# Patient Record
Sex: Male | Born: 1955 | Race: White | Hispanic: No | Marital: Married | Smoking: Never smoker
Health system: Southern US, Community
[De-identification: ages and names within clinical notes are randomized; demographics above are authoritative.]

## PROBLEM LIST (undated history)

## (undated) DIAGNOSIS — I10 Essential (primary) hypertension: Secondary | ICD-10-CM

## (undated) DIAGNOSIS — I4891 Unspecified atrial fibrillation: Secondary | ICD-10-CM

## (undated) DIAGNOSIS — B1920 Unspecified viral hepatitis C without hepatic coma: Secondary | ICD-10-CM

## (undated) DIAGNOSIS — D490 Neoplasm of unspecified behavior of digestive system: Secondary | ICD-10-CM

## (undated) DIAGNOSIS — E119 Type 2 diabetes mellitus without complications: Secondary | ICD-10-CM

## (undated) HISTORY — PX: CARDIOVERSION: SHX1299

## (undated) HISTORY — PX: KIDNEY STONE SURGERY: SHX686

---

## 1980-11-12 HISTORY — PX: SPLENECTOMY: SUR1306

## 2013-07-26 ENCOUNTER — Ambulatory Visit: Payer: Self-pay | Admitting: Emergency Medicine

## 2013-11-23 ENCOUNTER — Ambulatory Visit: Payer: Self-pay | Admitting: Physician Assistant

## 2020-10-10 ENCOUNTER — Emergency Department: Payer: Medicare Other

## 2020-10-10 ENCOUNTER — Other Ambulatory Visit: Payer: Self-pay

## 2020-10-10 ENCOUNTER — Emergency Department
Admission: EM | Admit: 2020-10-10 | Discharge: 2020-10-11 | Disposition: A | Discharge status: Home / Self Care | Payer: Medicare Other | Attending: Emergency Medicine | Admitting: Emergency Medicine

## 2020-10-10 DIAGNOSIS — R2990 Unspecified symptoms and signs involving the nervous system: Secondary | ICD-10-CM | POA: Diagnosis not present

## 2020-10-10 DIAGNOSIS — Z7901 Long term (current) use of anticoagulants: Secondary | ICD-10-CM | POA: Diagnosis not present

## 2020-10-10 DIAGNOSIS — M5416 Radiculopathy, lumbar region: Secondary | ICD-10-CM | POA: Diagnosis not present

## 2020-10-10 DIAGNOSIS — I4891 Unspecified atrial fibrillation: Secondary | ICD-10-CM | POA: Diagnosis not present

## 2020-10-10 DIAGNOSIS — M545 Low back pain, unspecified: Secondary | ICD-10-CM | POA: Diagnosis not present

## 2020-10-10 DIAGNOSIS — R531 Weakness: Secondary | ICD-10-CM | POA: Insufficient documentation

## 2020-10-10 DIAGNOSIS — R200 Anesthesia of skin: Secondary | ICD-10-CM | POA: Diagnosis present

## 2020-10-10 DIAGNOSIS — E119 Type 2 diabetes mellitus without complications: Secondary | ICD-10-CM | POA: Diagnosis not present

## 2020-10-10 DIAGNOSIS — I10 Essential (primary) hypertension: Secondary | ICD-10-CM | POA: Insufficient documentation

## 2020-10-10 DIAGNOSIS — R29898 Other symptoms and signs involving the musculoskeletal system: Secondary | ICD-10-CM | POA: Diagnosis not present

## 2020-10-10 HISTORY — DX: Type 2 diabetes mellitus without complications: E11.9

## 2020-10-10 HISTORY — DX: Neoplasm of unspecified behavior of digestive system: D49.0

## 2020-10-10 HISTORY — DX: Essential (primary) hypertension: I10

## 2020-10-10 HISTORY — DX: Unspecified atrial fibrillation: I48.91

## 2020-10-10 HISTORY — DX: Unspecified viral hepatitis C without hepatic coma: B19.20

## 2020-10-10 LAB — DIFFERENTIAL
Abs Immature Granulocytes: 0.05 10*3/uL (ref 0.00–0.07)
Basophils Absolute: 0.1 10*3/uL (ref 0.0–0.1)
Basophils Relative: 1 %
Eosinophils Absolute: 0.2 10*3/uL (ref 0.0–0.5)
Eosinophils Relative: 1 %
Immature Granulocytes: 0 %
Lymphocytes Relative: 20 %
Lymphs Abs: 2.9 10*3/uL (ref 0.7–4.0)
Monocytes Absolute: 0.9 10*3/uL (ref 0.1–1.0)
Monocytes Relative: 6 %
Neutro Abs: 10.3 10*3/uL — ABNORMAL HIGH (ref 1.7–7.7)
Neutrophils Relative %: 72 %

## 2020-10-10 LAB — COMPREHENSIVE METABOLIC PANEL
ALT: 33 U/L (ref 0–44)
AST: 30 U/L (ref 15–41)
Albumin: 4.5 g/dL (ref 3.5–5.0)
Alkaline Phosphatase: 94 U/L (ref 38–126)
Anion gap: 9 (ref 5–15)
BUN: 14 mg/dL (ref 8–23)
CO2: 26 mmol/L (ref 22–32)
Calcium: 10.2 mg/dL (ref 8.9–10.3)
Chloride: 102 mmol/L (ref 98–111)
Creatinine, Ser: 1.12 mg/dL (ref 0.61–1.24)
GFR, Estimated: >60 mL/min (ref >60)
Glucose, Bld: 207 mg/dL — ABNORMAL HIGH (ref 70–99)
Potassium: 4.8 mmol/L (ref 3.5–5.1)
Sodium: 137 mmol/L (ref 135–145)
Total Bilirubin: 0.6 mg/dL (ref 0.3–1.2)
Total Protein: 8.1 g/dL (ref 6.5–8.1)

## 2020-10-10 LAB — CBC
HCT: 45.6 % (ref 39.0–52.0)
Hemoglobin: 15.4 g/dL (ref 13.0–17.0)
MCH: 29.8 pg (ref 26.0–34.0)
MCHC: 33.8 g/dL (ref 30.0–36.0)
MCV: 88.2 fL (ref 80.0–100.0)
Platelets: 228 10*3/uL (ref 150–400)
RBC: 5.17 MIL/uL (ref 4.22–5.81)
RDW: 13.7 % (ref 11.5–15.5)
WBC: 14.4 10*3/uL — ABNORMAL HIGH (ref 4.0–10.5)
nRBC: 0 % (ref 0.0–0.2)

## 2020-10-10 LAB — APTT: aPTT: 25 seconds (ref 24–36)

## 2020-10-10 LAB — PROTIME-INR
INR: 1.1 (ref 0.8–1.2)
Prothrombin Time: 14.1 seconds (ref 11.4–15.2)

## 2020-10-10 MED ORDER — SODIUM CHLORIDE 0.9% FLUSH: 3.0000 mL | Freq: Once | INTRAVENOUS | Status: DC

## 2020-10-10 MED ORDER — GABAPENTIN 300 MG PO CAPS
300.0000 mg | ORAL_CAPSULE | Freq: Once | ORAL | Status: AC
  Administered 2020-10-10: 300 mg via ORAL
  Filled 2020-10-10: qty 1

## 2020-10-10 MED ORDER — GADOBUTROL 1 MMOL/ML IV SOLN
9.0000 mL | Freq: Once | INTRAVENOUS | Status: AC | PRN
  Administered 2020-10-10: 9 mL via INTRAVENOUS

## 2020-10-10 MED ORDER — METHYLPREDNISOLONE SODIUM SUCC 125 MG IJ SOLR
125.0000 mg | Freq: Once | INTRAMUSCULAR | Status: AC
  Administered 2020-10-10: 125 mg via INTRAVENOUS
  Filled 2020-10-10: qty 2

## 2020-10-10 MED ORDER — GABAPENTIN 300 MG PO CAPS: 300.0000 mg | ORAL_CAPSULE | Freq: Three times a day (TID) | ORAL | 0 refills | Status: AC

## 2020-10-10 MED ORDER — LACTATED RINGERS IV BOLUS
1000.0000 mL | Freq: Once | INTRAVENOUS | Status: AC
  Administered 2020-10-10: 1000 mL via INTRAVENOUS

## 2020-10-10 MED ORDER — PREDNISONE 10 MG (21) PO TBPK: ORAL_TABLET | ORAL | 0 refills | Status: DC

## 2020-10-10 NOTE — ED Notes (Signed)
Pt back from MRI 

## 2020-10-10 NOTE — ED Notes (Signed)
MD Neurologist not calling stoke

## 2020-10-10 NOTE — Discharge Instructions (Signed)
Please seek medical attention for any high fevers, chest pain, shortness of breath, change in behavior, persistent vomiting, bloody stool or any other new or concerning symptoms.  

## 2020-10-10 NOTE — ED Notes (Signed)
Pt brought in room.   Neurology at bedside

## 2020-10-10 NOTE — ED Notes (Signed)
Patient transported to MRI 

## 2020-10-10 NOTE — Consult Note (Signed)
NEURO HOSPITALIST CONSULT NOTE   Requestig physician: Dr. Cheri Fowler  Reason for Consult: Acute onset of LLE weakness  History obtained from:  Patient and Chart    HPI:                                                                                                                                          Jared Dominguez is an 64 y.o. 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 He states that yesterday, he was out all day on his riding lawnmower. While mowing in the seated position on the mower, he started to have left lower back pain which gradually became excruciating, rated as 8/10 by the patient. He also noticed new onset of left anterior thigh pain. He did not have paresthesias and did not notice on his own any sensory loss. He at that time also did not have any weakness. He tried to medicate away the pain, which worked somewhat, but not to the extent that he could sleep through it. He states "I was up all night from the pain". He thought that the pain was from a kidney stone. He got up this AM and was able to ambulate despite the pain. At 3 PM today, while walking down a set of stairs, he felt acute onset of LLE weakness and his leg started to buckle under him. He called EMS who brought him to the ED. On arrival, he stated that he could not lift his LLE at the hip with knee extended, but could lift up vertically with knee bent. A code stroke was called.    Past Medical History:  Diagnosis Date  . Atrial fibrillation ([place])   . Diabetes mellitus without complication ([place])   . Hepatitis C   . Hypertension   . Tumor of liver     Past Surgical History:  Procedure Laterality Date  . CARDIOVERSION    . KIDNEY STONE SURGERY    . SPLENECTOMY  1982     No family history on file.           Social History:  reports that he has never smoked. He has never used smokeless tobacco. He reports current alcohol use. He reports that he does not use drugs.  Allergies  Allergen Reactions  . Codeine   . Sulfa Antibiotics     MEDICATIONS:  Meloxicam PRN knee pain Atorvastatin 40mg   Lisinopril 5mg  daily Amlodipine 10mg  daily  Carvedilol 3.125mg  BID Tikosyn 500 mg bid  ROS:                                                                                                                                       As per HPI. Does not endorse additional complaints. Detailed ROS deferred in the context of acuity of presentation.    Blood pressure (!) 87/52, pulse 82, temperature 98.4 F (36.9 C), temperature source Oral, resp. rate 16, height 6' (1.829 m), weight 99.3 kg, SpO2 95 %.   General Examination:                                                                                                       Physical Exam  HEENT- [place]/AT   Lungs- Respirations unlabored Extremities- No edema  Neurological Examination Mental Status: Alert, oriented, thought content appropriate.  Speech fluent without evidence of aphasia.  Able to follow all commands without difficulty. Cranial Nerves: II: PERRL. Visual fields intact with no extinction to DSS.  III,IV, VI: No ptosis. EOMI.  V,VII: Smile symmetric, facial temp sensation equal bilaterally VIII: hearing intact to voice IX,X: No hypophonia XI: Symmetric XII: Midline tongue extension Motor: RUE and LUE: 5/5 RLE: 5/5 proximally and distally in all muscle groups LLE: Hip flexion 4-/5, hip adduction and abduction 5/5, knee flexion 5/5, knee extension 2/5, ADF and APF 5/5 Sensory: Decreased temp and FT sensation to anterior lower leg along the tibia, sparing the knee and foot. Sensation to  RLE and BUE is normal.  Deep Tendon Reflexes:  2+ bilateral brachioradialis and biceps Low amplitude left patellar, but there is crossed adductor response on the right.  Right patellar 3+ Achilles Trace bilaterally Plantars: Downgoing bilaterally Cerebellar: No ataxia with FNF bilaterally.  No ataxia with right H-S, limited assessment of left H-S shows no ataxia disproportionate to weakness Gait: Unable to bear weight with LLE with pivoting from CT table to wheelchair   Lab Results: Basic Metabolic Panel: Recent Labs  Lab 10/10/20 1845  NA 137  K 4.8  CL 102  CO2 26  GLUCOSE 207*  BUN 14  CREATININE 1.12  CALCIUM 10.2    CBC: Recent Labs  Lab 10/10/20 1845  WBC 14.4*  NEUTROABS 10.3*  HGB 15.4  HCT 45.6  MCV 88.2  PLT 228    Cardiac Enzymes: No results for input(s): CKTOTAL, CKMB, CKMBINDEX, TROPONINI in the last  168 hours.  Lipid Panel: No results for input(s): CHOL, TRIG, HDL, CHOLHDL, VLDL, LDLCALC in the last 168 hours.  Imaging: CT HEAD CODE STROKE WO CONTRAST  Result Date: 10/10/2020 CLINICAL DATA:  Code stroke. Acute neuro deficit. Left lower extremity numbness. EXAM: CT HEAD WITHOUT CONTRAST TECHNIQUE: Contiguous axial images were obtained from the base of the skull through the vertex without intravenous contrast. COMPARISON:  None. FINDINGS: Brain: No evidence of acute infarction, hemorrhage, hydrocephalus, extra-axial collection or mass lesion/mass effect. Vascular: Negative for hyperdense vessel Skull: Negative Sinuses/Orbits: Paranasal sinuses clear.  Negative orbit Other: None ASPECTS ([place] Stroke Program Early CT Score) - Ganglionic level infarction (caudate, lentiform nuclei, internal capsule, insula, M1-M3 cortex): 7 - Supraganglionic infarction (M4-M6 cortex): 3 Total score (0-10 with 10 being normal): 10 IMPRESSION: 1. Negative CT head 2. ASPECTS is 10 Electronically Signed   By: Franchot Gallo M.D.   On: 10/10/2020 19:05    Assessment: 64 year  old male presenting with acute onset of LLE weakness 1. Exam findings and symptoms are most consistent with an acute left L4 radiculopathy. Sensory loss along anterior leg below the knee is consistent with an L4 distribution. Anterior thigh pain is also consistent with L4 impingement. The pattern of knee extensor severe weakness and moderate hip flexor weakness is in the L4 distribution. There is sparing of ADF, APF, knee flexion and hip adduction and abduction strength.  2. Of note, there is also most likely chronic finding of crossed adductor response on the right when eliciting left patellar reflex, as well as 2 beats of clonus of right leg after eliciting right patellar. These findings suggest additional pathology, possibly affecting the thoracic cord, which may be chronic. DDx includes thoracic disc protrusion.  3. CT head negative for acute abnormality.  4. Overall localizing features of exam are not consistent with acute stroke.   Recommendations: 1. MRI of lumbar spine with and without contrast 2. MRI of thoracic spine with and without contrast   Electronically signed: Dr. Kerney Elbe 10/10/2020, 7:22 PM

## 2020-10-10 NOTE — ED Provider Notes (Signed)
[facility] Emergency Department Provider Note   ____________________________________________   First MD Initiated Contact with Patient 10/10/20 1901     (approximate)  I have reviewed the triage vital signs and the nursing notes.   HISTORY  Chief Complaint Numbness    HPI Jared Dominguez is a 64 y.o. male with a stated past medical history of type 2 diabetes and atrial fibrillation and hypertension who presents for left lower extremity and back pain.  Patient states that he has had left lumbar back pain that is been worsening over the last 6 months however patient is having worsening pain over the last 24 hours.  This sharp, 9/10, nonradiating left lower lumbar paraspinal back pain is associated with left lower extremity numbness as well as muscular weakness over the anterior portion of his left thigh with the inability to extend the leg at the knee joint.  Patient states that he attempted to bear weight on this left lower extremity prior to arrival and fell over to the side as he could not bear weight on this leg.  Patient states the symptoms have much improved at this time and denies any pain however does endorse continuing weakness in the left lower extremity.  Patient denies any recent trauma or symptoms similar to this in the past.  Patient denies any history of lumbar spinal surgery.  Given symptoms, code stroke called from triage         Past Medical History:  Diagnosis Date  . Atrial fibrillation ([place])   . Diabetes mellitus without complication ([place])   . Hepatitis C   . Hypertension   . Tumor of liver     There are no problems to display for this patient.   Past Surgical History:  Procedure Laterality Date  . CARDIOVERSION    . KIDNEY STONE SURGERY    . SPLENECTOMY  1982    Prior to Admission medications   Not on File    Allergies Codeine and Sulfa antibiotics  No family history on file.  Social History Social History    Tobacco Use  . Smoking status: Never Smoker  . Smokeless tobacco: Never Used  Substance Use Topics  . Alcohol use: Yes    Comment: on holidays   . Drug use: Never    Review of Systems Constitutional: No fever/chills Eyes: No visual changes. ENT: No sore throat. Cardiovascular: Denies chest pain. Respiratory: Denies shortness of breath. Gastrointestinal: No abdominal pain.  No nausea, no vomiting.  No diarrhea. Genitourinary: Negative for dysuria. Musculoskeletal: Negative for acute arthralgias Skin: Negative for rash. Neurological: Negative for headaches, endorses weakness/numbness/paresthesias in left lower extremity Psychiatric: Negative for suicidal ideation/homicidal ideation   ____________________________________________   PHYSICAL EXAM:  VITAL SIGNS: ED Triage Vitals  Enc Vitals Group     BP 10/10/20 1838 (!) 87/52     Pulse Rate 10/10/20 1829 82     Resp 10/10/20 1829 16     Temp 10/10/20 1829 98.4 F (36.9 C)     Temp Source 10/10/20 1829 Oral     SpO2 10/10/20 1829 95 %     Weight 10/10/20 1830 219 lb (99.3 kg)     Height 10/10/20 1830 6' (1.829 m)     Head Circumference --      Peak Flow --      Pain Score 10/10/20 1830 3     Pain Loc --      Pain Edu? --      Excl. in  GC? --    Constitutional: Alert and oriented. Well appearing and in no acute distress. Eyes: Conjunctivae are normal. PERRL. Head: Atraumatic. Nose: No congestion/rhinnorhea. Mouth/Throat: Mucous membranes are moist. Neck: No stridor Cardiovascular: Grossly normal heart sounds.  Good peripheral circulation. Respiratory: Normal respiratory effort.  No retractions. Gastrointestinal: Soft and nontender. No distention. Musculoskeletal: No obvious deformities Neurologic:  Normal speech and language.  Sensation decreased over the left shin, decreased range of motion at the left knee due to muscular weakness.  3/5 strength in the left quad Skin:  Skin is warm and dry. No rash  noted. Psychiatric: Mood and affect are normal. Speech and behavior are normal.  ____________________________________________   LABS (all labs ordered are listed, but only abnormal results are displayed)  Labs Reviewed  CBC - Abnormal; Notable for the following components:      Result Value   WBC 14.4 (*)    All other components within normal limits  DIFFERENTIAL - Abnormal; Notable for the following components:   Neutro Abs 10.3 (*)    All other components within normal limits  COMPREHENSIVE METABOLIC PANEL - Abnormal; Notable for the following components:   Glucose, Bld 207 (*)    All other components within normal limits  PROTIME-INR  APTT  CBG MONITORING, ED  I-STAT CREATININE, ED   ____________________________________________  EKG  ED ECG REPORT I, Naaman Plummer, the attending physician, personally viewed and interpreted this ECG.  Date: 10/10/2020 EKG Time: 1833 Rate: 113 Rhythm: Atrial fibrillation with rapid ventricular response QRS Axis: normal Intervals: normal ST/T Wave abnormalities: normal Narrative Interpretation: no evidence of acute ischemia  ____________________________________________  RADIOLOGY  ED MD interpretation: CT noncontrast of the head shows no evidence of acute abnormalities including no intracranial hemorrhage, edema, or mass-effect  Official radiology report(s): CT HEAD CODE STROKE WO CONTRAST  Result Date: 10/10/2020 CLINICAL DATA:  Code stroke. Acute neuro deficit. Left lower extremity numbness. EXAM: CT HEAD WITHOUT CONTRAST TECHNIQUE: Contiguous axial images were obtained from the base of the skull through the vertex without intravenous contrast. COMPARISON:  None. FINDINGS: Brain: No evidence of acute infarction, hemorrhage, hydrocephalus, extra-axial collection or mass lesion/mass effect. Vascular: Negative for hyperdense vessel Skull: Negative Sinuses/Orbits: Paranasal sinuses clear.  Negative orbit Other: None ASPECTS ([place]  Stroke Program Early CT Score) - Ganglionic level infarction (caudate, lentiform nuclei, internal capsule, insula, M1-M3 cortex): 7 - Supraganglionic infarction (M4-M6 cortex): 3 Total score (0-10 with 10 being normal): 10 IMPRESSION: 1. Negative CT head 2. ASPECTS is 10 Electronically Signed   By: Franchot Gallo M.D.   On: 10/10/2020 19:05    ____________________________________________   PROCEDURES  Procedure(s) performed (including Critical Care):  .1-3 Lead EKG Interpretation Performed by: Naaman Plummer, MD Authorized by: Naaman Plummer, MD     Interpretation: abnormal     ECG rate:  97   ECG rate assessment: normal     Rhythm: atrial fibrillation     Ectopy: none     Conduction: normal       ____________________________________________   INITIAL IMPRESSION / ASSESSMENT AND PLAN / ED COURSE  As part of my medical decision making, I reviewed the following data within the [place] notes reviewed and incorporated, Labs reviewed, EKG interpreted, Old chart reviewed, Radiograph reviewed and Notes from prior ED visits reviewed and incorporated        Patient is a 64 year old male with a stated past medical history as described above who presents for left lower  extremity neurologic symptoms.  Differential diagnosis includes CVA, TIA, cauda equina syndrome, spinal epidural abscess, spinal nerve compression.  A code stroke was called from triage and patient was evaluated by the neurologist who recommended an MRI of the thoracic and lumbar spine with concerns that the symptoms are likely coming from left L4 radiculopathy.  Patient agrees with plan and these MRIs are pending at the end of my shift.  Care of this patient will be signed out to the oncoming physician at the end of my shift.  All pertinent patient information conveyed and all questions answered.  All further care and disposition decisions will be made by the oncoming physician.       ____________________________________________   FINAL CLINICAL IMPRESSION(S) / ED DIAGNOSES  Final diagnoses:  Numbness and tingling of left lower extremity  Weakness of left lower extremity  Pain in left lumbar region of back     ED Discharge Orders    None       Note:  This document was prepared using Dragon voice recognition software and may include unintentional dictation errors.   Naaman Plummer, MD 10/10/20 2000

## 2020-10-10 NOTE — ED Triage Notes (Signed)
PT to ED via EMS from home. PT started having low back pain last night that got progressively worse, pt unable to sleep, thought maybe it was a kidney stone. Took a percocet this morning and Toradol and helped a little but pain came back. However, pt went to stand up earlier and fell down d/t his L leg being numb. PT states prior today he noticed pain in his leg. PT cannot move leg out and up but can lift up vertically with knee bent.

## 2020-10-10 NOTE — Progress Notes (Signed)
CODE STROKE- PHARMACY COMMUNICATION   Time CODE STROKE called/page received: 1856  Time response to CODE STROKE was made (in person or via phone): review of chart  Time Stroke Kit retrieved from [place] (only if needed): NA  Name of Provider/Nurse contacted: no code stroke per Neurology    Tawnya Crook ,PharmD Clinical Pharmacist  10/10/2020  7:46 PM

## 2020-10-10 NOTE — ED Notes (Signed)
Neurology MD read CT

## 2020-10-10 NOTE — ED Notes (Signed)
ED Provider at bedside. 

## 2020-10-11 NOTE — ED Notes (Signed)
Pt states that he will come back if he is unable to get around home.

## 2020-10-11 NOTE — ED Notes (Addendum)
Pt is refusing to get up and walk and says he is not able to bear weight on the left leg. Pt states he wants to go home and  That at home he has crutches and a wheel chair and is able to get around at home with those assistive devices. Pt states he also is going to have physical therapy for a knee injury. Pt states that he wants to go home and does not want to stay in the hospital. Pt stated "If I dont think I could get around at home, I would stay in the hospital."  ER Provider notified and stated he is okay with pt being discharged, if pt feels safe and wants to be discharged. Pt to be taken to car in wheelchair with RN and helped into car.

## 2020-10-11 NOTE — ED Notes (Signed)
Pt is able to get into wheelchair without assistance. Pt is also able to stand up with no assistance.

## 2021-11-03 IMAGING — MR MR LUMBAR SPINE WO/W CM
6 of 8 series · 36 of 48 positions shown · IV contrast (agent unspecified)
Comparison: None.

CLINICAL DATA: Left lower extremity pain and back pain.

EXAM:
MRI THORACIC SPINE WITHOUT CONTRAST
MRI LUMBAR SPINE WITHOUT AND WITH CONTRAST
TECHNIQUE: Multiplanar and multiecho pulse sequences of the thoracic and lumbar
spine were obtained without intravenous contrast. Post-contrast
images of the lumbar spine were also obtained.

[Series 26: T2 · sagittal · 4.0mm · 0.88mm/px · 4 of 17 slices shown (1 of 3)]
[im 1/17]
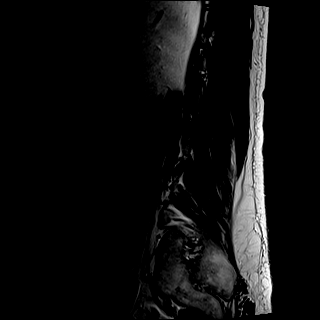
[im 6/17]
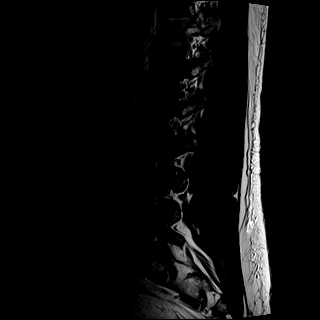
[im 11/17]
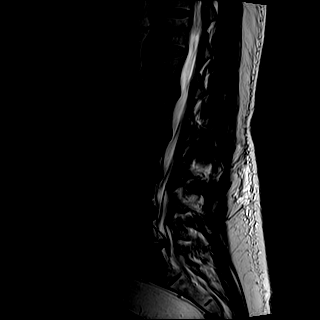
[im 17/17]
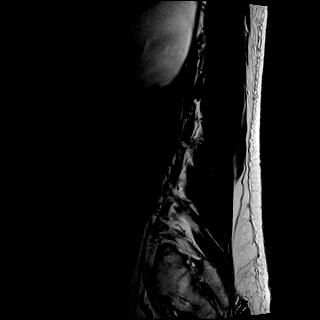

[Series 27: T1 · sagittal · 4.0mm · 0.88mm/px · 4 of 17 slices shown (1 of 2)]
[im 1/17]
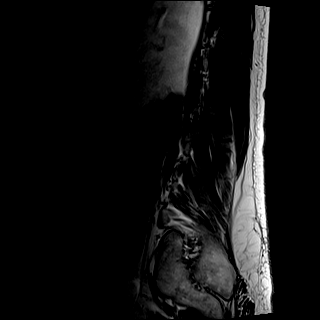
[im 6/17]
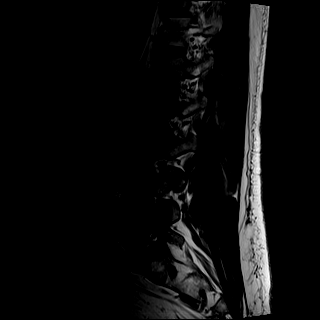
[im 11/17]
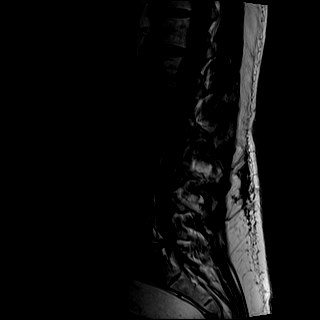
[im 17/17]
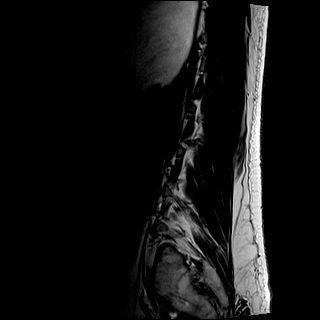

[Series 29: T2 · axial · 4.0mm · 0.78mm/px · z∈[-574,-356]mm · 8 of 39 slices shown (2 of 3)]
[im 1/39]
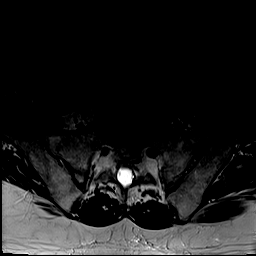
[im 6/39]
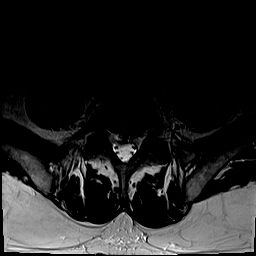
[im 11/39]
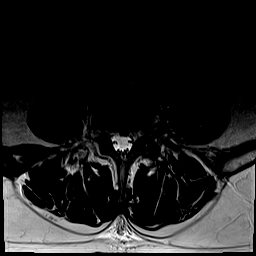
[im 17/39]
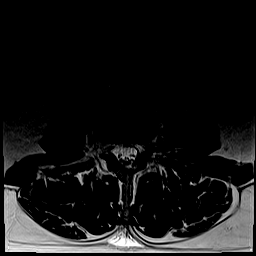
[im 22/39]
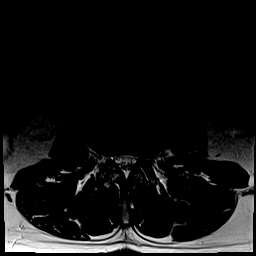
[im 28/39]
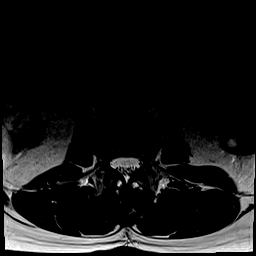
[im 33/39]
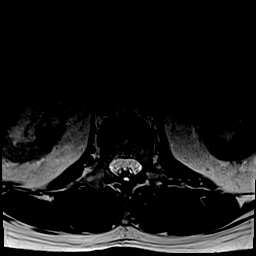
[im 39/39]
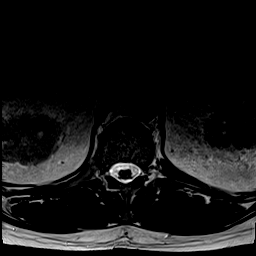

[Series 30: T1 · axial · 4.0mm · 0.39mm/px · z∈[-574,-356]mm · 8 of 39 slices shown (2 of 2)]
[im 1/39]
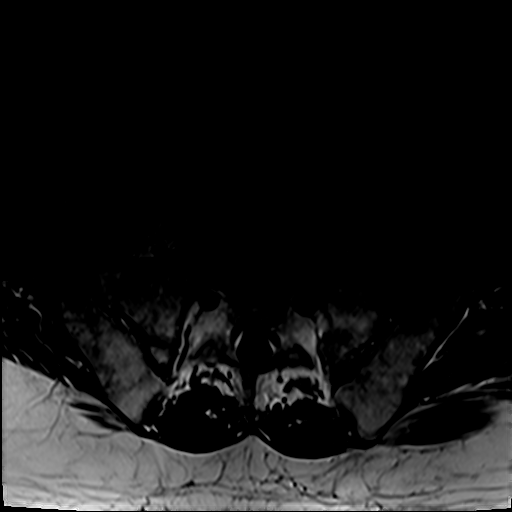
[im 6/39]
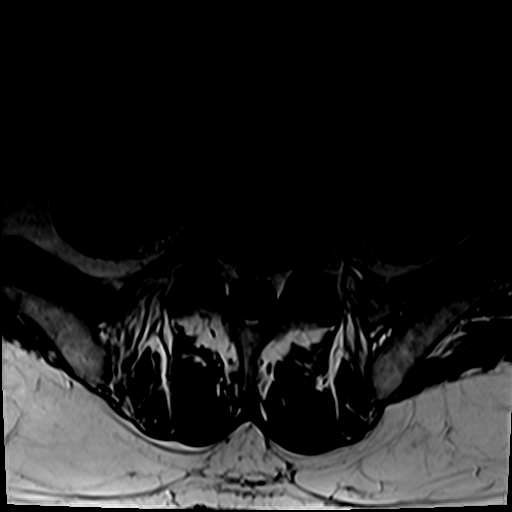
[im 11/39]
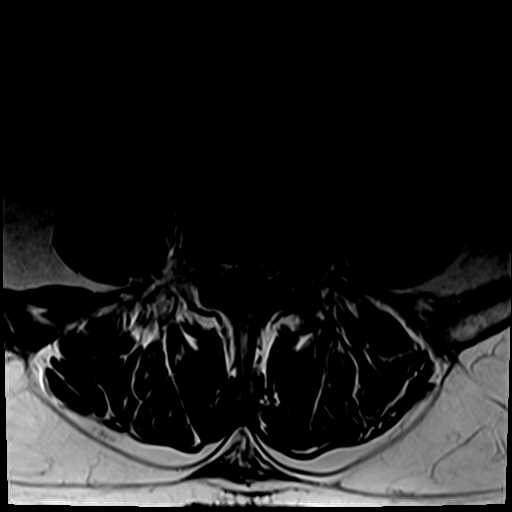
[im 17/39]
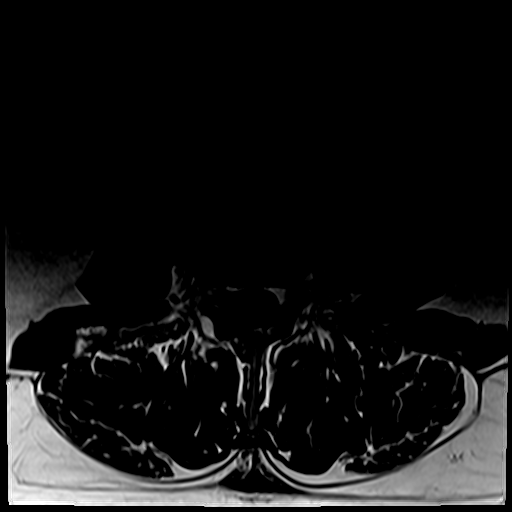
[im 22/39]
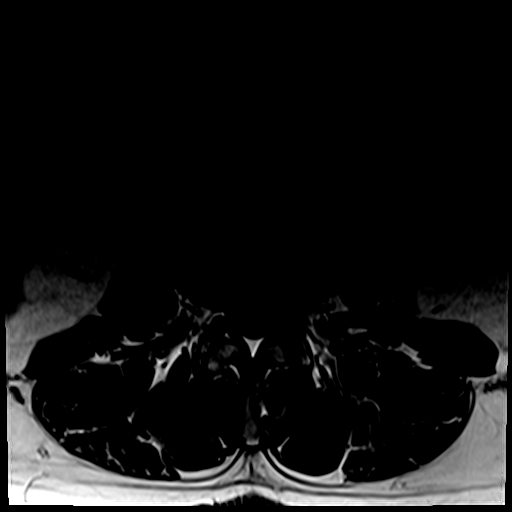
[im 28/39]
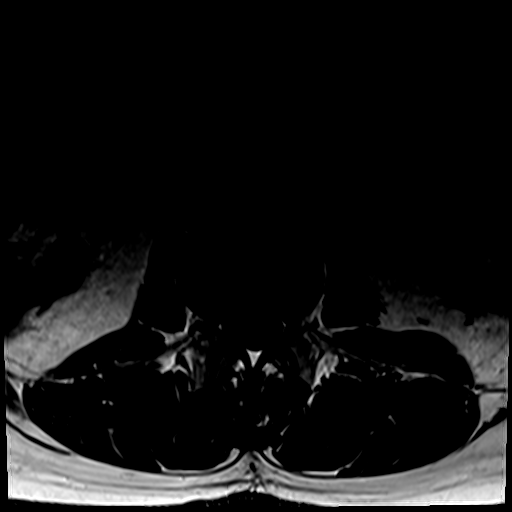
[im 33/39]
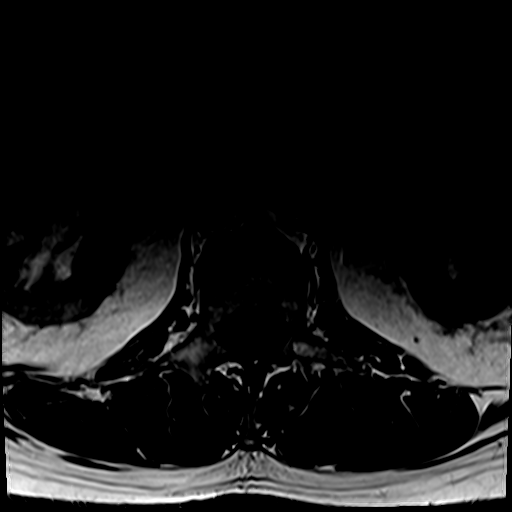
[im 39/39]
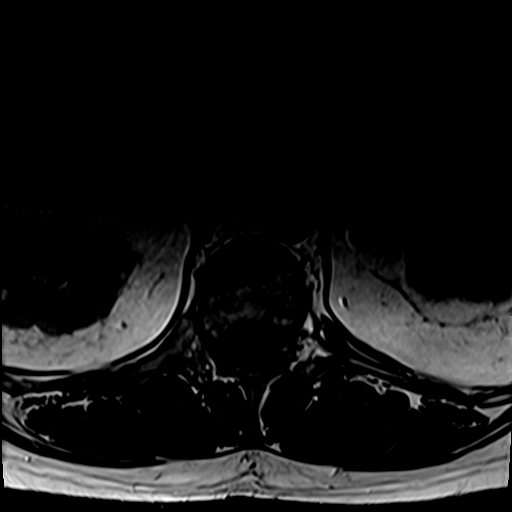

[Series 31: T2 · axial · 4.0mm · 0.78mm/px · z∈[-574,-356]mm · 8 of 39 slices shown (3 of 3)]
[im 1/39]
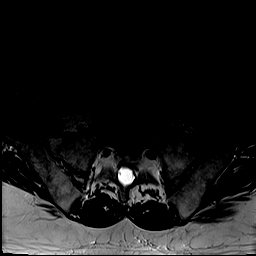
[im 6/39]
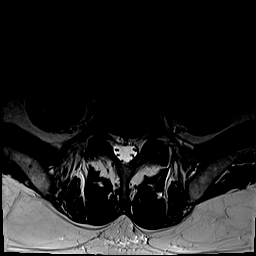
[im 11/39]
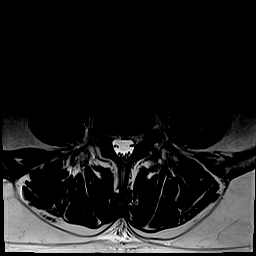
[im 17/39]
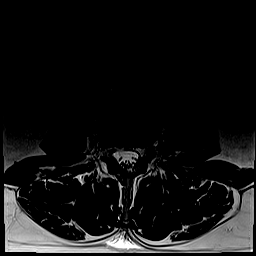
[im 22/39]
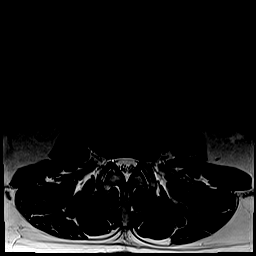
[im 28/39]
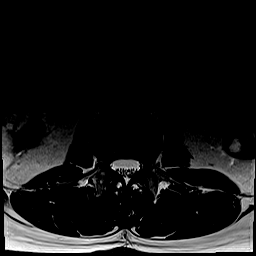
[im 33/39]
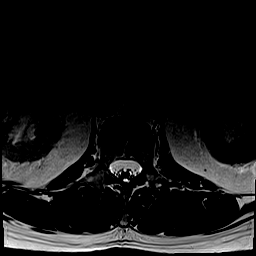
[im 39/39]
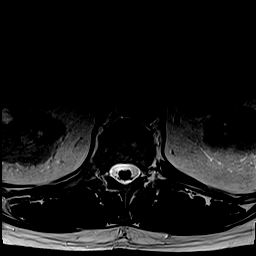

[Series 32: T1 fat-sat post-contrast · sagittal · 4.0mm · 0.88mm/px · 4 of 17 slices shown]
[im 1/17]
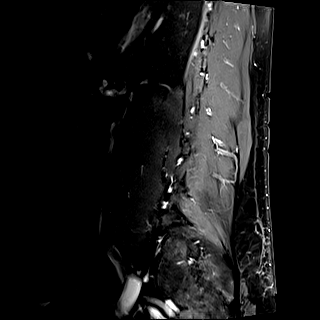
[im 6/17]
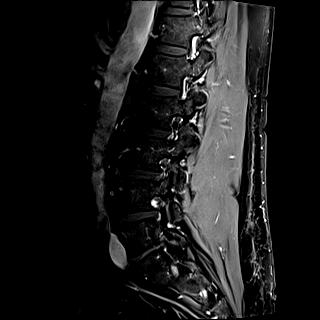
[im 11/17]
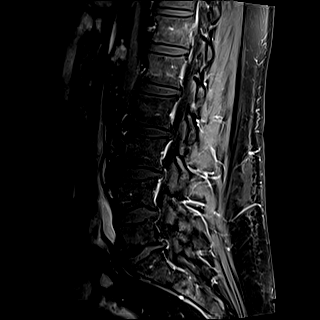
[im 17/17]
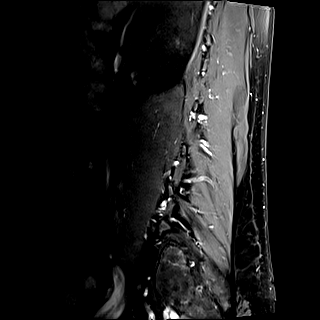

[36 of 48 positions shown; findings below may reference images not displayed]

FINDINGS: MRI THORACIC SPINE FINDINGS

Alignment:  Physiologic.

Vertebrae: No fracture, evidence of discitis, or bone lesion.

Cord:  Normal signal and morphology.

Paraspinal and other soft tissues: There are multiple old posterior
right rib fractures.

Disc levels:

T1-2: Small right subarticular disc protrusion.  No stenosis.

T2-3: Small right subarticular disc protrusion with mild right
foraminal stenosis.

T3-4: Small right subarticular disc protrusion without stenosis.

T4-5: Small disc bulge without stenosis.

T5-6: Small disc bulge without stenosis.

T9-10: Small right subarticular disc protrusion without stenosis.

T11: Small central disc protrusion without stenosis.

MRI LUMBAR SPINE FINDINGS

Segmentation:  Standard.

Alignment:  Physiologic.

Vertebrae:  No fracture, evidence of discitis, or bone lesion.

Conus medullaris and cauda equina: Conus extends to the L1 level.
Conus and cauda equina appear normal.

Paraspinal and other soft tissues: Negative

Disc levels:

T12-L1: Normal.

L1-2: Normal.

L2-3: Small disc bulge without spinal canal stenosis. There is a
small left subarticular/foraminal annular fissure. No neural
impingement.

L3-4: Small disc bulge without stenosis.  Normal facets.

L4-5: Small left asymmetric disc bulge. Normal facets. No spinal
canal or neural foraminal stenosis.

L5-S1: Disc space narrowing with small disc bulge. Mild bilateral
neural foraminal stenosis. No central spinal canal stenosis.

No abnormal contrast enhancement.
IMPRESSION: 1. No acute abnormality of the lumbar or thoracic spine.
2. Mild multilevel degenerative disc disease without spinal canal
stenosis.

## 2022-05-20 ENCOUNTER — Ambulatory Visit
Admission: EM | Admit: 2022-05-20 | Discharge: 2022-05-20 | Disposition: A | Discharge status: Home / Self Care | Payer: Medicare Other

## 2022-05-20 ENCOUNTER — Encounter: Payer: Self-pay | Admitting: Emergency Medicine

## 2022-05-20 DIAGNOSIS — L03114 Cellulitis of left upper limb: Secondary | ICD-10-CM

## 2022-05-20 MED ORDER — CEPHALEXIN 500 MG PO CAPS: 500.0000 mg | ORAL_CAPSULE | Freq: Four times a day (QID) | ORAL | 0 refills | Status: AC

## 2022-05-20 NOTE — Discharge Instructions (Signed)
You were seen for redness and soreness to arm and are being treated for cellulitis.   - Take the antibiotics as prescribed until they're finished. If you think you're having a reaction, stop the medication, take benadryl and go to the nearest urgent care/emergency room. Take a probiotic while taking the antibiotic to decrease the chances of stomach upset.  -Warm compresses to the area as needed for pain. -Follow-up if area starts to spread, increased drainage is noted, or you start feeling ill with fever chills or/and body aches.  Take care, Dr. Marland Kitchen, NP-c

## 2022-05-20 NOTE — ED Provider Notes (Signed)
[facility] - [place], [place]   Name: Jared Dominguez DOB: 1956/06/26 MRN: [id number] CSN: [id number] PCP: Erline Levine, MD  Arrival date and time:  05/20/22 1307  Chief Complaint:  Puncture Wound (Left forearm)   NOTE: Prior to seeing the patient today, I have reviewed the triage nursing documentation and vital signs. Clinical staff has updated patient's PMH/PSHx, current medication list, and drug allergies/intolerances to ensure comprehensive history available to assist in medical decision making.   History:   HPI: Jared Dominguez is a 66 y.o. male who presents today with complaints of pain and swelling to left forearm.  Patient states he removed a wooden splinter from his arm approximately 1 week ago.  Over the past 72 hours, has noticed increased pain and swelling to that area.  No chills body aches or fevers.  Patient's wife is a retired Marine scientist is concerned of possible skin infection.  No recent antibiotics.   Past Medical History:  Diagnosis Date   Atrial fibrillation ([place])    Diabetes mellitus without complication ([place])    Hepatitis C    Hypertension    Tumor of liver     Past Surgical History:  Procedure Laterality Date   [place]    History reviewed. No pertinent family history.  Social History   Tobacco Use   Smoking status: Never   Smokeless tobacco: Never  Vaping Use   Vaping Use: Never used  Substance Use Topics   Alcohol use: Yes    Comment: on holidays    Drug use: Never    There are no problems to display for this patient.   Home Medications:    Current Meds  Medication Sig   allopurinol (ZYLOPRIM) 100 MG tablet Take 1 tablet by mouth daily.   amLODipine (NORVASC) 10 MG tablet Take 1 tablet by mouth daily.   atorvastatin (LIPITOR) 40 MG tablet Take 1 tablet by mouth daily.   carvedilol (COREG) 3.125 MG tablet Take by mouth.   cephALEXin (KEFLEX) 500 MG capsule Take 1 capsule  (500 mg total) by mouth 4 (four) times daily for 5 days.   dofetilide (TIKOSYN) 500 MCG capsule Take by mouth.   fluticasone (FLONASE) 50 MCG/ACT nasal spray 1 spray into each nostril daily.   glipiZIDE (GLUCOTROL XL) 2.5 MG 24 hr tablet Take 1 tablet by mouth daily.   sertraline (ZOLOFT) 50 MG tablet Take 1 tablet by mouth daily.   sodium bicarbonate 650 MG tablet Take by mouth.   zolpidem (AMBIEN) 5 MG tablet Take 1 tablet by mouth at bedtime as needed.    Allergies:   Codeine and Sulfa antibiotics  Review of Systems (ROS): Review of Systems  Constitutional:  Negative for fatigue and fever.  Skin:  Positive for color change and wound.  All other systems reviewed and are negative.    Vital Signs: Today's Vitals   05/20/22 1347 05/20/22 1348 05/20/22 1352 05/20/22 1408  BP:   138/81   Pulse:   91   Resp:   15   Temp:   98.3 F (36.8 C)   TempSrc:   Oral   SpO2:   99%   Weight:  215 lb (97.5 kg)    Height:  6' (1.829 m)    PainSc: 3    3     Physical Exam: Physical Exam Vitals and nursing note reviewed.  Constitutional:  Appearance: Normal appearance.  Cardiovascular:     Rate and Rhythm: Normal rate and regular rhythm.     Pulses: Normal pulses.     Heart sounds: Normal heart sounds.  Pulmonary:     Effort: Pulmonary effort is normal.     Breath sounds: Normal breath sounds.  Skin:    General: Skin is warm and dry.     Findings: Rash present.     Comments: Erythema noticed to left forearm, healed puncture wound noted in middle of erythematous area.  Moderate size nodule approximately 2 cm palpated deep within the tissue.  No fluctuant area for drainage.  Neurological:     General: No focal deficit present.     Mental Status: He is alert and oriented to person, place, and time.  Psychiatric:        Mood and Affect: Mood normal.        Behavior: Behavior normal.      Urgent Care Treatments / Results:   LABS: PLEASE NOTE: all labs that were ordered this  encounter are listed, however only abnormal results are displayed. Labs Reviewed - No data to display  EKG: -None  RADIOLOGY: No results found.  PROCEDURES: Procedures  MEDICATIONS RECEIVED THIS VISIT: Medications - No data to display  PERTINENT CLINICAL COURSE NOTES/UPDATES:   Initial Impression / Assessment and Plan / Urgent Care Course:  Pertinent labs & imaging results that were available during my care of the patient were personally reviewed by me and considered in my medical decision making (see lab/imaging section of note for values and interpretations).  Jared Dominguez is a 66 y.o. male who presents to [facility] today with complaints of left arm pain and redness, diagnosed with cellulitis, and treated as such with the medications below. NP and patient reviewed discharge instructions below during visit.   Patient is well appearing overall in clinic today. He does not appear to be in any acute distress. Presenting symptoms (see HPI) and exam as documented above.   I have reviewed the follow up and strict return precautions for any new or worsening symptoms. Patient is aware of symptoms that would be deemed urgent/emergent, and would thus require further evaluation either here or in the emergency department. At the time of discharge, he verbalized understanding and consent with the discharge plan as it was reviewed with him. All questions were fielded by provider and/or clinic staff prior to patient discharge.    Final Clinical Impressions / Urgent Care Diagnoses:   Final diagnoses:  Cellulitis of left upper extremity    New Prescriptions:  [place] Controlled Substance Registry consulted? Not Applicable  Meds ordered this encounter  Medications   cephALEXin (KEFLEX) 500 MG capsule    Sig: Take 1 capsule (500 mg total) by mouth 4 (four) times daily for 5 days.    Dispense:  20 capsule    Refill:  0      Discharge Instructions      You were seen for redness and  soreness to arm and are being treated for cellulitis.   - Take the antibiotics as prescribed until they're finished. If you think you're having a reaction, stop the medication, take benadryl and go to the nearest urgent care/emergency room. Take a probiotic while taking the antibiotic to decrease the chances of stomach upset.  -Warm compresses to the area as needed for pain. -Follow-up if area starts to spread, increased drainage is noted, or you start feeling ill with fever chills or/and  body aches.  Take care, Dr. Marland Kitchen, NP-c     Recommended Follow up Care:  Patient encouraged to follow up with the following provider within the specified time frame, or sooner as dictated by the severity of his symptoms. As always, he was instructed that for any urgent/emergent care needs, he should seek care either here or in the emergency department for more immediate evaluation.   Gertie Baron, DNP, NP-c   Gertie Baron, NP 05/20/22 1610

## 2022-05-20 NOTE — ED Triage Notes (Signed)
Patient states that he got and removed a splinter from his left forearm about a week ago.  Patient states that he has swelling and pain at the site.  Patient denies drainage.  Patient denies fevers.

## 2022-12-09 ENCOUNTER — Encounter: Payer: Self-pay | Admitting: Emergency Medicine

## 2022-12-09 ENCOUNTER — Ambulatory Visit
Admission: EM | Admit: 2022-12-09 | Discharge: 2022-12-09 | Disposition: A | Discharge status: Home / Self Care | Payer: Medicare Other | Attending: Family Medicine | Admitting: Family Medicine

## 2022-12-09 ENCOUNTER — Ambulatory Visit: Payer: Self-pay

## 2022-12-09 DIAGNOSIS — J329 Chronic sinusitis, unspecified: Secondary | ICD-10-CM | POA: Diagnosis not present

## 2022-12-09 DIAGNOSIS — J40 Bronchitis, not specified as acute or chronic: Secondary | ICD-10-CM

## 2022-12-09 DIAGNOSIS — Z87891 Personal history of nicotine dependence: Secondary | ICD-10-CM

## 2022-12-09 MED ORDER — AMOXICILLIN-POT CLAVULANATE 875-125 MG PO TABS: 1.0000 | ORAL_TABLET | Freq: Two times a day (BID) | ORAL | 0 refills | Status: AC

## 2022-12-09 MED ORDER — DOXYCYCLINE HYCLATE 100 MG PO CAPS: 100.0000 mg | ORAL_CAPSULE | Freq: Two times a day (BID) | ORAL | 0 refills | Status: AC

## 2022-12-09 NOTE — ED Provider Notes (Signed)
[facility]    CSN: [id number] Arrival date & time: 12/09/22  4098      History   Chief Complaint Chief Complaint  Patient presents with   Cough    Appointment    HPI Jared Dominguez is a 67 y.o. male.   HPI   Jared Dominguez presents for cough and nasal congestion for the past 3 weeks. Has sinus headache with pressure and dental pain. Symptoms started like a "sinus thing then turned into a cold." His symptoms never went away.  No fevers but had night sweats. Yesterday, he took some cough medicine but has used several OTC medications without relief.   His wife had a cough and congestion after him but she is well.  Grand-daughter has an ear infection.   He quit smoking several years and states he smoked for more than 20 years.      Past Medical History:  Diagnosis Date   Atrial fibrillation ([place])    Diabetes mellitus without complication ([place])    Hepatitis C    Hypertension    Tumor of liver     There are no problems to display for this patient.   Past Surgical History:  Procedure Laterality Date   CARDIOVERSION     KIDNEY STONE SURGERY     SPLENECTOMY  1982       Home Medications    Prior to Admission medications   Medication Sig Start Date End Date Taking? Authorizing Provider  amoxicillin-clavulanate (AUGMENTIN) 875-125 MG tablet Take 1 tablet by mouth every 12 (twelve) hours for 5 days. 12/09/22 12/14/22 Yes Holy Battenfield, DO  doxycycline (VIBRAMYCIN) 100 MG capsule Take 1 capsule (100 mg total) by mouth 2 (two) times daily for 5 days. 12/09/22 12/14/22 Yes Tammatha Cobb, DO  allopurinol (ZYLOPRIM) 100 MG tablet Take 1 tablet by mouth daily. 10/02/21   [provider]  amLODipine (NORVASC) 10 MG tablet Take 1 tablet by mouth daily. 11/29/21   [provider]  atorvastatin (LIPITOR) 40 MG tablet Take 1 tablet by mouth daily. 07/18/21   [provider]  carvedilol (COREG) 3.125 MG tablet Take by mouth. 11/28/21   [provider]  dofetilide (TIKOSYN) 500 MCG capsule Take by mouth. 03/26/22   [provider]  ELIQUIS 5 MG TABS tablet Take 5 mg by mouth 2 (two) times daily. 03/23/22   [provider]  fluticasone (FLONASE) 50 MCG/ACT nasal spray 1 spray into each nostril daily. 02/17/21   [provider]  gabapentin (NEURONTIN) 300 MG capsule Take 1 capsule (300 mg total) by mouth 3 (three) times daily. 10/10/20 10/10/21  Nance Pear, MD  glipiZIDE (GLUCOTROL XL) 2.5 MG 24 hr tablet Take 1 tablet by mouth daily. 11/14/21   [provider]  lisinopril (ZESTRIL) 5 MG tablet Take 5 mg by mouth daily. 05/17/22   [provider]  magnesium oxide (MAG-OX) 400 MG tablet Take by mouth.    [provider]  omeprazole (PRILOSEC) 40 MG capsule Take 40 mg by mouth daily. 03/23/22   [provider]  predniSONE (STERAPRED UNI-PAK 21 TAB) 10 MG (21) TBPK tablet Per packaging instructions 10/10/20   Nance Pear, MD  sertraline (ZOLOFT) 50 MG tablet Take 1 tablet by mouth daily. 11/29/21   [provider]  zolpidem (AMBIEN) 5 MG tablet Take 1 tablet by mouth at bedtime as needed. 03/23/22   [provider]    Family History History reviewed. No pertinent family history.  Social History Social  History   Tobacco Use   Smoking status: Never   Smokeless tobacco: Never  Vaping Use   Vaping Use: Never used  Substance Use Topics   Alcohol use: Yes    Comment: on holidays    Drug use: Never     Allergies   Codeine and Sulfa antibiotics   Review of Systems Review of Systems: negative unless otherwise stated in HPI.      Physical Exam Triage Vital Signs ED Triage Vitals  Enc Vitals Group     BP 12/09/22 1018 (!) 140/79     Pulse Rate 12/09/22 1018 69     Resp 12/09/22 1018 15     Temp 12/09/22 1018 97.8 F (36.6 C)     Temp Source 12/09/22 1018 Oral     SpO2 12/09/22 1018 96 %     Weight 12/09/22 1015 214 lb 15.2 oz (97.5  kg)     Height 12/09/22 1015 6' (1.829 m)     Head Circumference --      Peak Flow --      Pain Score 12/09/22 1015 4     Pain Loc --      Pain Edu? --      Excl. in [place]? --    No data found.  Updated Vital Signs BP (!) 140/79 (BP Location: Left Arm)   Pulse 69   Temp 97.8 F (36.6 C) (Oral)   Resp 15   Ht 6' (1.829 m)   Wt 97.5 kg   SpO2 96%   BMI 29.15 kg/m   Visual Acuity Right Eye Distance:   Left Eye Distance:   Bilateral Distance:    Right Eye Near:   Left Eye Near:    Bilateral Near:     Physical Exam GEN:     alert, non-toxic appearing male in no distress    HENT:  mucus membranes moist, oropharyngeal without lesions or exudate, no tonsillar hypertrophy, moderate erythematous edematous turbinates, clear nasal discharge EYES:   pupils equal and reactive, no scleral injection or discharge NECK:  normal ROM, no lymphadenopathy, no meningismus   RESP:  no increased work of breathing, rales right upper lung CVS:   regular rate and irregularly irregular rhythm Skin:   warm and dry,     UC Treatments / Results  Labs (all labs ordered are listed, but only abnormal results are displayed) Labs Reviewed - No data to display  EKG   Radiology No results found.  Procedures Procedures (including critical care time)  Medications Ordered in UC Medications - No data to display  Initial Impression / Assessment and Plan / UC Course  I have reviewed the triage vital signs and the nursing notes.  Pertinent labs & imaging results that were available during my care of the patient were reviewed by me and considered in my medical decision making (see chart for details).       Pt is a 67 y.o. male who presents for 3 weeks of cough and nasal congestion that is not improving.  [place] is afebrile here without recent antipyretics. Satting adequately on room air. Overall pt is  non-toxic appearing, well hydrated, without respiratory distress. Pulmonary exam is remarkable  for rales in right upper lung.  After shared decision making, we will pursue chest x-ray at this time as it currently would not change management.  COVID  and influenza testing deferred due to length of symptoms.  Treat acute sinobronchitis vs CAP with antibiotics as below. Typical duration  of symptoms discussed. Return and ED precautions given and patient voiced understanding.   Discussed MDM, treatment plan and plan for follow-up with patient who agrees with plan.      Final Clinical Impressions(s) / UC Diagnoses   Final diagnoses:  Sinobronchitis  Former smoker     Discharge Instructions      Stop by the pharmacy to pick up your prescriptions.  Follow up with your primary care provider if your symptoms do not improve with the medications prescribed.       ED Prescriptions     Medication Sig Dispense Auth. Provider   amoxicillin-clavulanate (AUGMENTIN) 875-125 MG tablet Take 1 tablet by mouth every 12 (twelve) hours for 5 days. 10 tablet Hafiz Irion, DO   doxycycline (VIBRAMYCIN) 100 MG capsule Take 1 capsule (100 mg total) by mouth 2 (two) times daily for 5 days. 10 capsule Lyndee Hensen, DO      PDMP not reviewed this encounter.   Lyndee Hensen, DO 12/09/22 1340

## 2022-12-09 NOTE — ED Triage Notes (Signed)
Patient c/o cough, sinus congestion and pressure, and nasal congestion that started 3 weeks ago.  Patient denies fevers.

## 2022-12-09 NOTE — Discharge Instructions (Addendum)
Stop by the pharmacy to pick up your prescriptions.  Follow up with your primary care provider if your symptoms do not improve with the medications prescribed.

## 2022-12-17 ENCOUNTER — Ambulatory Visit
Admission: RE | Admit: 2022-12-17 | Discharge: 2022-12-17 | Disposition: A | Discharge status: Home / Self Care | Payer: Medicare Other | Source: Ambulatory Visit | Attending: Family Medicine | Admitting: Family Medicine

## 2022-12-17 ENCOUNTER — Ambulatory Visit (INDEPENDENT_AMBULATORY_CARE_PROVIDER_SITE_OTHER): Payer: Medicare Other

## 2022-12-17 VITALS — BP 146/80 | HR 74 | Temp 98.3°F | Ht 72.0 in | Wt 214.9 lb

## 2022-12-17 DIAGNOSIS — J329 Chronic sinusitis, unspecified: Secondary | ICD-10-CM

## 2022-12-17 DIAGNOSIS — Z87891 Personal history of nicotine dependence: Secondary | ICD-10-CM

## 2022-12-17 DIAGNOSIS — J40 Bronchitis, not specified as acute or chronic: Secondary | ICD-10-CM

## 2022-12-17 MED ORDER — LEVOFLOXACIN 750 MG PO TABS: 750.0000 mg | ORAL_TABLET | Freq: Every day | ORAL | 0 refills | Status: AC

## 2022-12-17 MED ORDER — PREDNISONE 10 MG (21) PO TBPK: ORAL_TABLET | Freq: Every day | ORAL | 0 refills | Status: AC

## 2022-12-17 NOTE — ED Provider Notes (Incomplete)
[facility]    CSN: [id number] Arrival date & time: 12/17/22  1437      History   Chief Complaint Chief Complaint  Patient presents with   Cough    Same problem as last visit. Sinus, headache and cough - Entered by patient    HPI Jared Dominguez is a 67 y.o. male.   HPI   Jared Dominguez presents for persistent cough and bloody discharge from nose. After the 3rd day of antibitics his symptoms returned. Continues to have sinus headaches and cough.  He felt like he had to sneeze. His wife wanted him to be seen.    Fever : no  Chills: no Sore throat: no   Cough: no Sputum: no Nasal congestion : no  Rhinorrhea: no Myalgias: no Appetite: normal  Hydration: normal  Abdominal pain: no Nausea: no Vomiting: no Diarrhea: No Rash: No Sleep disturbance: no Headache: no      Past Medical History:  Diagnosis Date   Atrial fibrillation ([place])    Diabetes mellitus without complication ([place])    Hepatitis C    Hypertension    Tumor of liver     There are no problems to display for this patient.   Past Surgical History:  Procedure Laterality Date   CARDIOVERSION     KIDNEY STONE SURGERY     SPLENECTOMY  1982       Home Medications    Prior to Admission medications   Medication Sig Start Date End Date Taking? Authorizing Provider  allopurinol (ZYLOPRIM) 100 MG tablet Take 1 tablet by mouth daily. 10/02/21   [provider]  amLODipine (NORVASC) 10 MG tablet Take 1 tablet by mouth daily. 11/29/21   [provider]  atorvastatin (LIPITOR) 40 MG tablet Take 1 tablet by mouth daily. 07/18/21   [provider]  carvedilol (COREG) 3.125 MG tablet Take by mouth. 11/28/21   [provider]  dofetilide (TIKOSYN) 500 MCG capsule Take by mouth. 03/26/22   [provider]  ELIQUIS 5 MG TABS tablet Take 5 mg by mouth 2 (two) times daily. 03/23/22   [provider]  fluticasone (FLONASE) 50 MCG/ACT nasal spray 1 spray into  each nostril daily. 02/17/21   [provider]  gabapentin (NEURONTIN) 300 MG capsule Take 1 capsule (300 mg total) by mouth 3 (three) times daily. 10/10/20 10/10/21  Nance Pear, MD  glipiZIDE (GLUCOTROL XL) 2.5 MG 24 hr tablet Take 1 tablet by mouth daily. 11/14/21   [provider]  lisinopril (ZESTRIL) 5 MG tablet Take 5 mg by mouth daily. 05/17/22   [provider]  magnesium oxide (MAG-OX) 400 MG tablet Take by mouth.    [provider]  omeprazole (PRILOSEC) 40 MG capsule Take 40 mg by mouth daily. 03/23/22   [provider]  predniSONE (STERAPRED UNI-PAK 21 TAB) 10 MG (21) TBPK tablet Per packaging instructions 10/10/20   Nance Pear, MD  sertraline (ZOLOFT) 50 MG tablet Take 1 tablet by mouth daily. 11/29/21   [provider]  zolpidem (AMBIEN) 5 MG tablet Take 1 tablet by mouth at bedtime as needed. 03/23/22   [provider]    Family History History reviewed. No pertinent family history.  Social History Social History   Tobacco Use   Smoking status: Never   Smokeless tobacco: Never  Vaping Use   Vaping Use: Never used  Substance Use Topics   Alcohol use: Yes    Comment: on holidays    Drug  use: Never     Allergies   Promethazine, Codeine, and Sulfa antibiotics   Review of Systems Review of Systems: negative unless otherwise stated in HPI.      Physical Exam Triage Vital Signs ED Triage Vitals  Enc Vitals Group     BP 12/17/22 1514 (!) 146/80     Pulse Rate 12/17/22 1514 74     Resp --      Temp 12/17/22 1514 98.3 F (36.8 C)     Temp Source 12/17/22 1514 Oral     SpO2 12/17/22 1514 98 %     Weight 12/17/22 1513 214 lb 15.2 oz (97.5 kg)     Height 12/17/22 1513 6' (1.829 m)     Head Circumference --      Peak Flow --      Pain Score --      Pain Loc --      Pain Edu? --      Excl. in [place]? --    No data found.  Updated Vital Signs BP (!) 146/80 (BP Location: Left Arm)   Pulse 74    Temp 98.3 F (36.8 C) (Oral)   Ht 6' (1.829 m)   Wt 97.5 kg   SpO2 98%   BMI 29.15 kg/m   Visual Acuity Right Eye Distance:   Left Eye Distance:   Bilateral Distance:    Right Eye Near:   Left Eye Near:    Bilateral Near:     Physical Exam GEN:     alert, non-toxic appearing male in no distress ***   HENT:  mucus membranes moist, oropharyngeal ***without lesions or ***exudate, no*** tonsillar hypertrophy, *** mild oropharyngeal erythema , *** moderate erythematous edematous turbinates, ***clear nasal discharge, ***bilateral TM normal EYES:   pupils equal and reactive, ***no scleral injection or discharge NECK:  normal ROM, no ***lymphadenopathy, ***no meningismus   RESP:  no increased work of breathing, ***clear to auscultation bilaterally CVS:   regular rate ***and rhythm Skin:   warm and dry, no rash on visible skin***    UC Treatments / Results  Labs (all labs ordered are listed, but only abnormal results are displayed) Labs Reviewed - No data to display  EKG   Radiology DG Chest 2 View  Result Date: 12/17/2022 CLINICAL DATA:  Cough EXAM: CHEST - 2 VIEW COMPARISON:  Prior chest x-ray 11/23/2013 FINDINGS: Cardiac and mediastinal contours are within normal limits. Atherosclerotic calcifications present in the transverse aorta. 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 The lungs are clear. No focal airspace infiltrate, pleural effusion or pneumothorax. No suspicious pulmonary mass or nodule. No acute bony abnormality. IMPRESSION: No active cardiopulmonary disease. Electronically Signed   By: Jacqulynn Cadet M.D.   On: 12/17/2022 15:15    Procedures Procedures (including critical care time)  Medications Ordered in UC Medications - No data to display  Initial Impression / Assessment and Plan / UC Course  I have reviewed the triage vital signs and the nursing notes.  Pertinent labs & imaging results that were available during my  care of the patient were reviewed by me and considered in my medical decision making (see chart for details).       Pt is a 67 y.o. male who presents for *** days of respiratory symptoms. Jared Dominguez is ***afebrile here without recent antipyretics. Satting well on room air. Overall pt is ***non-toxic appearing, well hydrated, without respiratory distress. Pulmonary exam ***is unremarkable.  COVID and influenza testing  obtained ***and was negative. ***Pt to quarantine until COVID test results or longer if positive.  I will call patient with test results, if positive. History consistent with ***viral respiratory illness. Discussed symptomatic treatment.  Explained lack of efficacy of antibiotics in viral disease.  Typical duration of symptoms discussed.   Return and ED precautions given and voiced understanding. Discussed MDM, treatment plan and plan for follow-up with patient/guardian*** who agrees with plan.     Final Clinical Impressions(s) / UC Diagnoses   Final diagnoses:  None   Discharge Instructions   None    ED Prescriptions   None    PDMP not reviewed this encounter.

## 2022-12-17 NOTE — ED Triage Notes (Addendum)
Pt states he was seen 12/09/22 pt states abx helped some, cough, sinuses, pt reports he noticed some blood when blowing his nose this morning

## 2022-12-17 NOTE — Discharge Instructions (Signed)
Stop by the pharmacy to pick up your prescriptions.  Follow up with your primary care provider next week as scheduled.   Take some probiotics to prevent depleting your good gut bacteria.
